# Patient Record
Sex: Female | Born: 1975 | Race: White | Hispanic: Yes | Marital: Married | Smoking: Never smoker
Health system: Southern US, Community
[De-identification: ages and names within clinical notes are randomized; demographics above are authoritative.]

## PROBLEM LIST (undated history)

## (undated) DIAGNOSIS — F329 Major depressive disorder, single episode, unspecified: Secondary | ICD-10-CM

## (undated) DIAGNOSIS — O139 Gestational [pregnancy-induced] hypertension without significant proteinuria, unspecified trimester: Secondary | ICD-10-CM

## (undated) DIAGNOSIS — F32A Depression, unspecified: Secondary | ICD-10-CM

## (undated) DIAGNOSIS — O24419 Gestational diabetes mellitus in pregnancy, unspecified control: Secondary | ICD-10-CM

## (undated) HISTORY — DX: Depression, unspecified: F32.A

## (undated) HISTORY — DX: Gestational diabetes mellitus in pregnancy, unspecified control: O24.419

## (undated) HISTORY — DX: Major depressive disorder, single episode, unspecified: F32.9

## (undated) HISTORY — DX: Gestational (pregnancy-induced) hypertension without significant proteinuria, unspecified trimester: O13.9

---

## 2000-05-13 ENCOUNTER — Encounter: Payer: Self-pay | Admitting: *Deleted

## 2000-05-13 ENCOUNTER — Ambulatory Visit (HOSPITAL_COMMUNITY): Admission: RE | Admit: 2000-05-13 | Discharge: 2000-05-13 | Payer: Self-pay | Admitting: *Deleted

## 2000-10-28 ENCOUNTER — Inpatient Hospital Stay (HOSPITAL_COMMUNITY): Admission: AD | Admit: 2000-10-28 | Discharge: 2000-10-30 | Payer: Self-pay | Admitting: *Deleted

## 2000-10-30 ENCOUNTER — Encounter: Payer: Self-pay | Admitting: Obstetrics & Gynecology

## 2000-11-02 ENCOUNTER — Inpatient Hospital Stay (HOSPITAL_COMMUNITY): Admission: AD | Admit: 2000-11-02 | Discharge: 2000-11-05 | Payer: Self-pay | Admitting: Obstetrics

## 2003-03-22 ENCOUNTER — Encounter: Admission: RE | Admit: 2003-03-22 | Discharge: 2003-03-22 | Payer: Self-pay | Admitting: Internal Medicine

## 2003-06-06 ENCOUNTER — Encounter: Admission: RE | Admit: 2003-06-06 | Discharge: 2003-06-06 | Payer: Self-pay | Admitting: *Deleted

## 2003-06-06 ENCOUNTER — Inpatient Hospital Stay (HOSPITAL_COMMUNITY): Admission: AD | Admit: 2003-06-06 | Discharge: 2003-06-09 | Payer: Self-pay | Admitting: *Deleted

## 2003-06-06 ENCOUNTER — Encounter: Payer: Self-pay | Admitting: Obstetrics & Gynecology

## 2005-04-10 ENCOUNTER — Ambulatory Visit (HOSPITAL_COMMUNITY): Admission: RE | Admit: 2005-04-10 | Discharge: 2005-04-10 | Payer: Self-pay | Admitting: *Deleted

## 2005-06-21 ENCOUNTER — Ambulatory Visit (HOSPITAL_COMMUNITY): Admission: RE | Admit: 2005-06-21 | Discharge: 2005-06-21 | Payer: Self-pay | Admitting: *Deleted

## 2005-08-21 ENCOUNTER — Ambulatory Visit (HOSPITAL_COMMUNITY): Admission: RE | Admit: 2005-08-21 | Discharge: 2005-08-21 | Payer: Self-pay | Admitting: *Deleted

## 2005-08-26 ENCOUNTER — Ambulatory Visit: Payer: Self-pay | Admitting: Hematology and Oncology

## 2005-08-29 ENCOUNTER — Ambulatory Visit: Payer: Self-pay | Admitting: Family Medicine

## 2005-09-02 ENCOUNTER — Inpatient Hospital Stay (HOSPITAL_COMMUNITY): Admission: AD | Admit: 2005-09-02 | Discharge: 2005-09-03 | Payer: Self-pay | Admitting: *Deleted

## 2005-09-02 ENCOUNTER — Ambulatory Visit: Payer: Self-pay | Admitting: Obstetrics & Gynecology

## 2005-09-04 ENCOUNTER — Ambulatory Visit: Payer: Self-pay | Admitting: Family Medicine

## 2005-09-04 ENCOUNTER — Inpatient Hospital Stay (HOSPITAL_COMMUNITY): Admission: AD | Admit: 2005-09-04 | Discharge: 2005-09-04 | Payer: Self-pay | Admitting: *Deleted

## 2005-09-05 ENCOUNTER — Inpatient Hospital Stay (HOSPITAL_COMMUNITY): Admission: AD | Admit: 2005-09-05 | Discharge: 2005-09-07 | Payer: Self-pay | Admitting: Obstetrics & Gynecology

## 2005-09-05 ENCOUNTER — Ambulatory Visit: Payer: Self-pay | Admitting: Family Medicine

## 2006-06-27 IMAGING — US US ABDOMEN COMPLETE
1 series · 14 of 25 positions shown · non-contrast
Comparison: none

CLINICAL DATA: 40 weeks pregnant. Right upper quadrant pain

ABDOMEN ULTRASOUND:
TECHNIQUE: Complete abdominal ultrasound examination was performed including
evaluation of the liver, gallbladder, bile ducts, pancreas, kidneys, spleen,
IVC, and abdominal aorta.

[Series 1: us abdomen complete · 0.39mm/px · 14 of 56 slices shown]
[im 1/56]
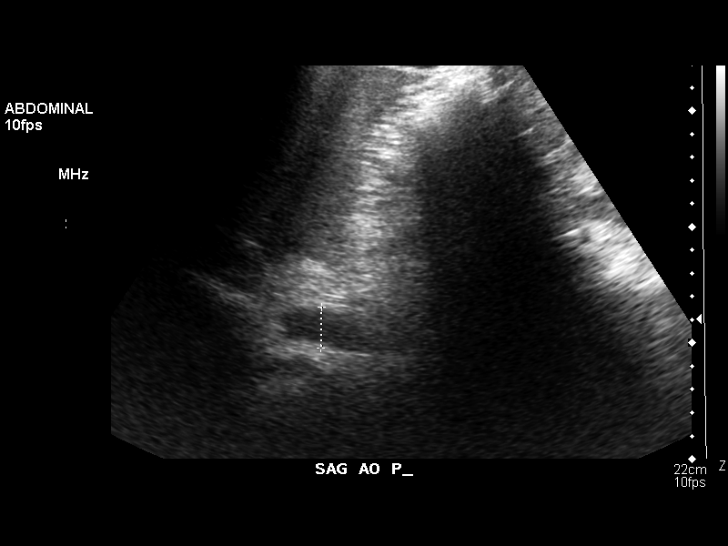
[im 5/56]
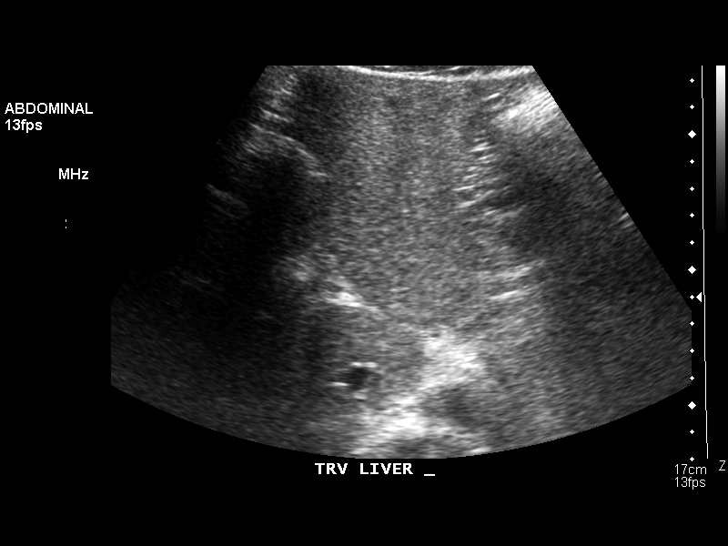
[im 10/56]
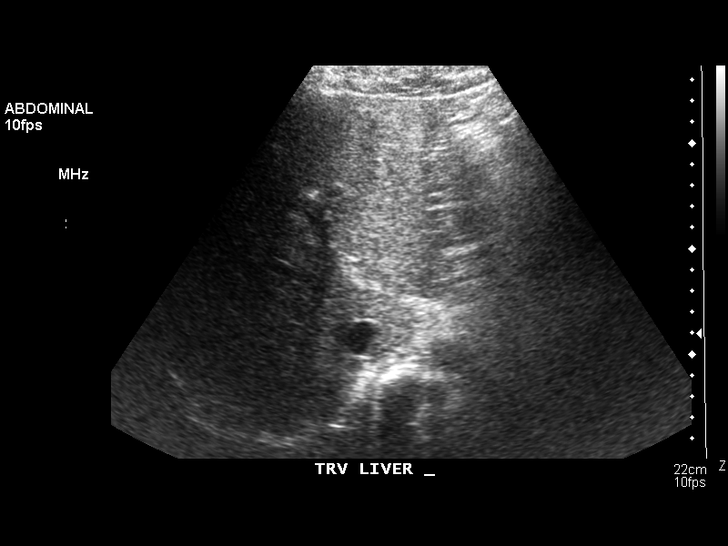
[im 14/56]
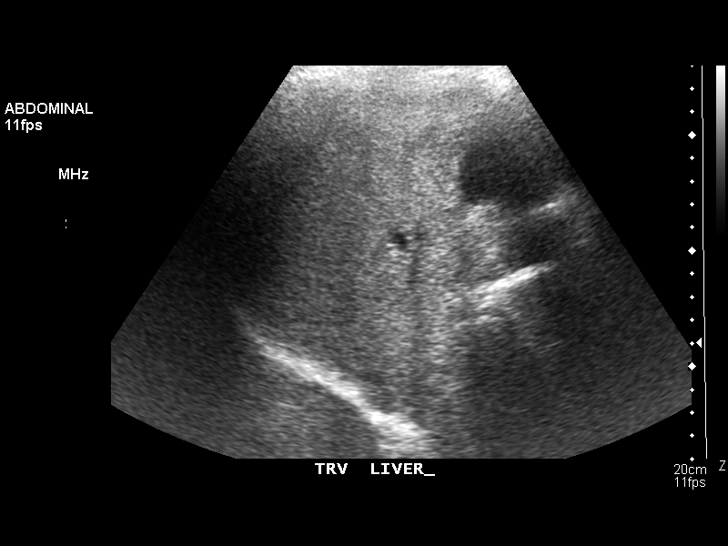
[im 19/56]
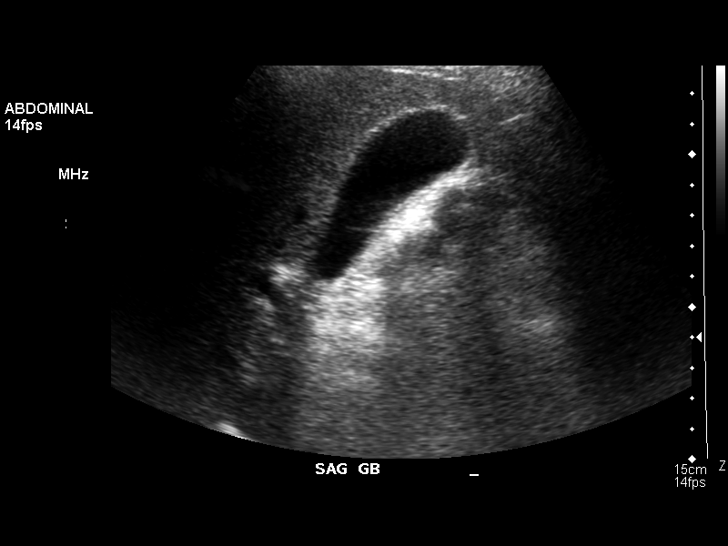
[im 21/56]
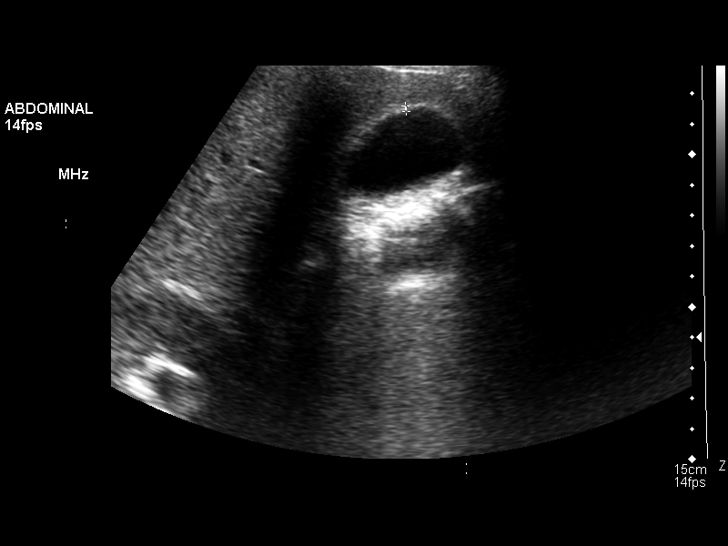
[im 26/56]
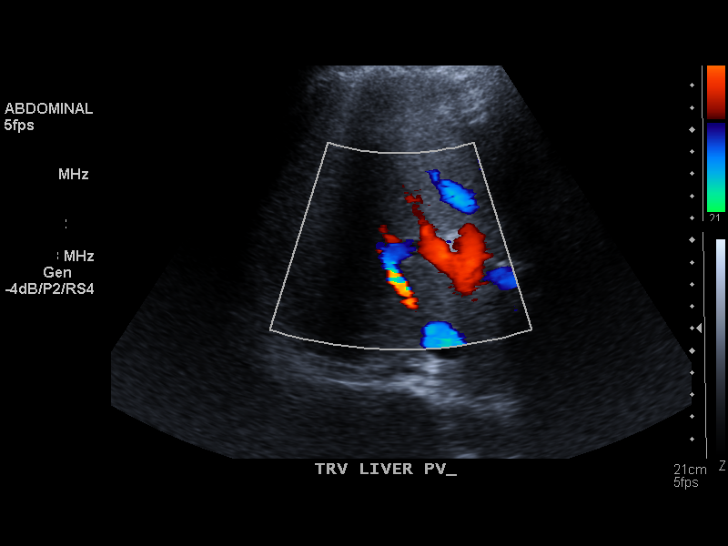
[im 30/56]
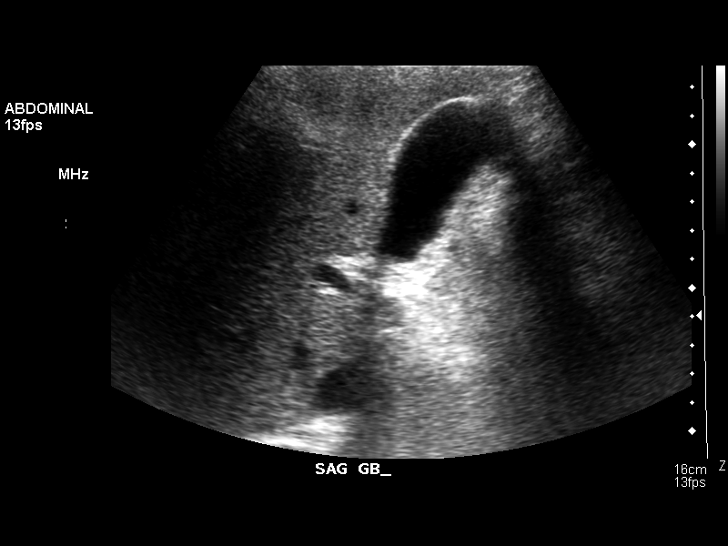
[im 35/56]
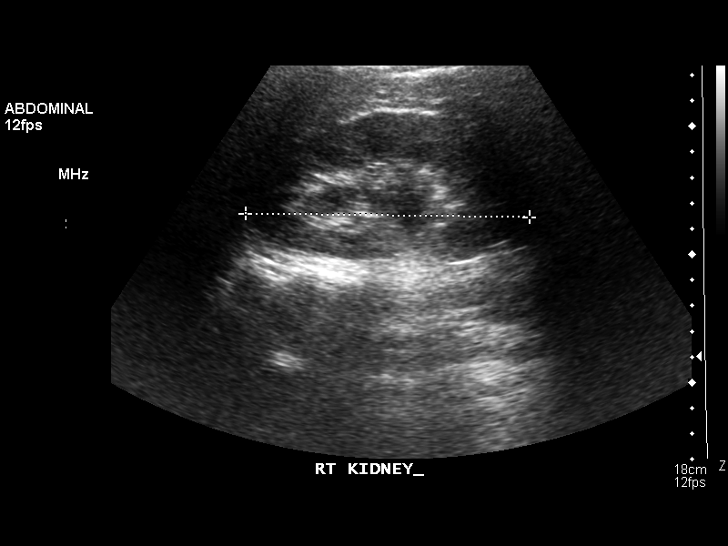
[im 37/56]
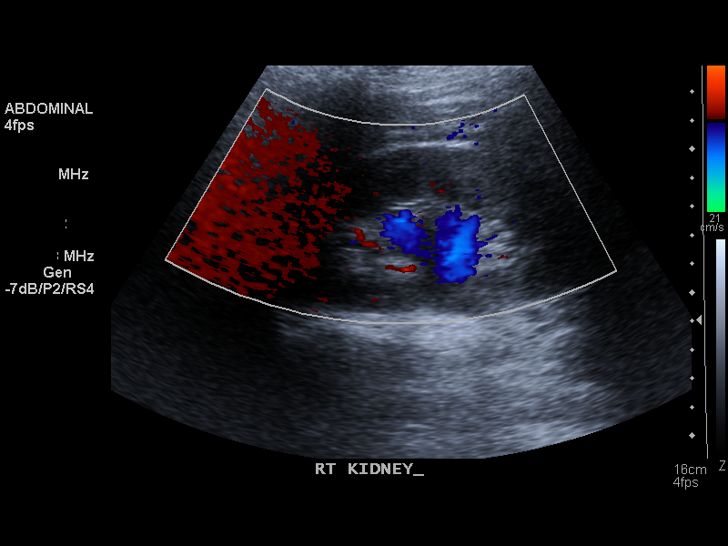
[im 42/56]
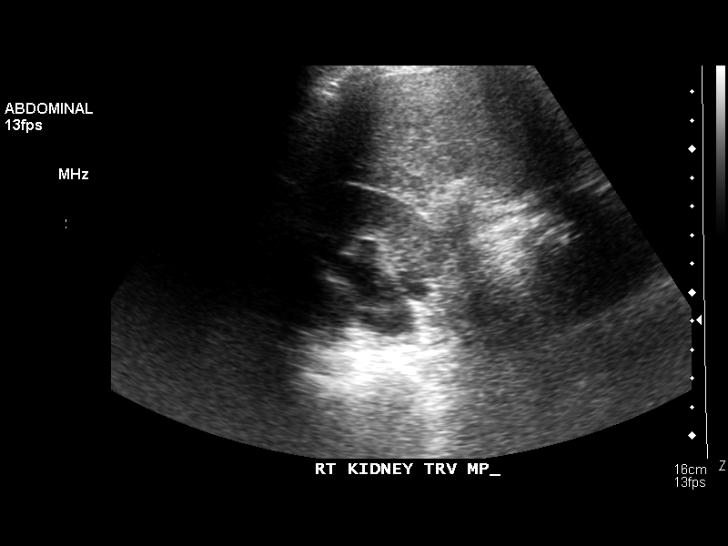
[im 46/56]
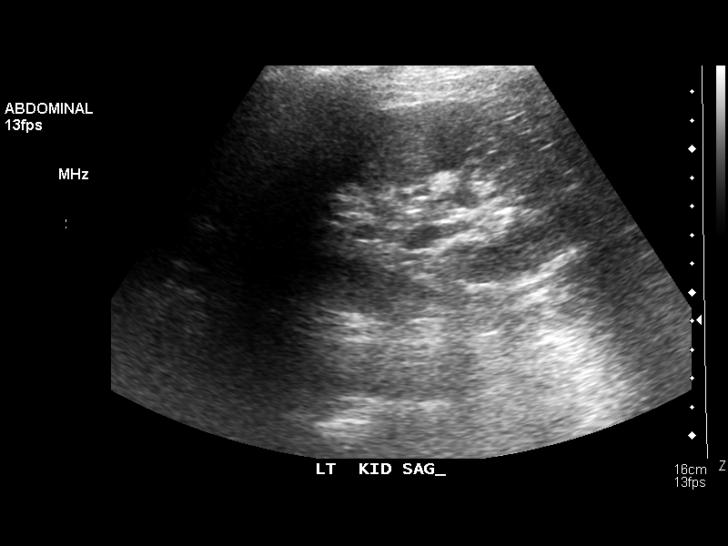
[im 51/56]
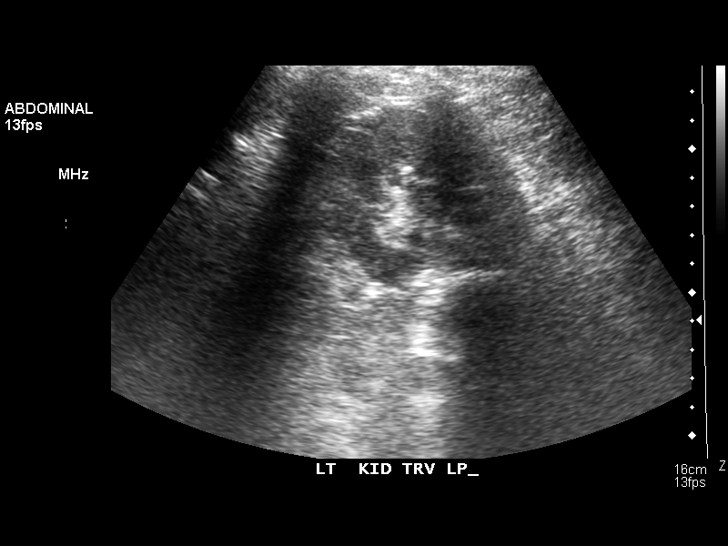
[im 56/56]
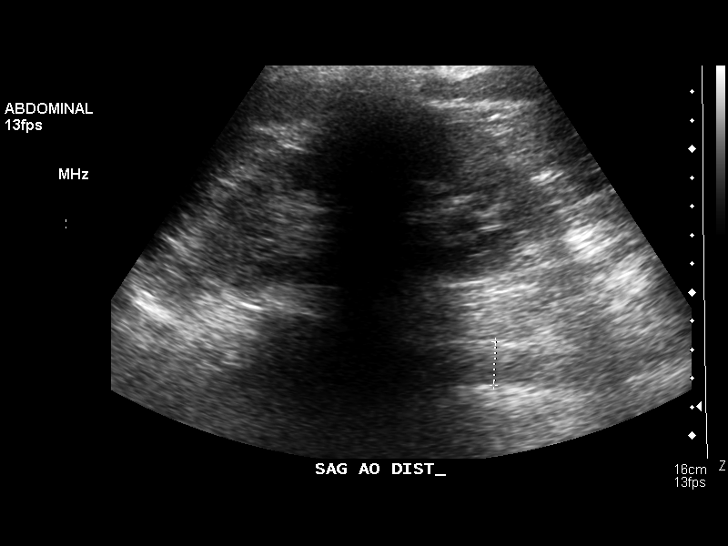

[14 of 25 positions shown; findings below may reference images not displayed]

FINDINGS: There is no evidence of gallstones or biliary ductal dilatation.  The
liver is within normal limits in echogenicity, and no focal liver lesions are
seen.  The visualized portions of the IVC are unremarkable. Pancreas not
visualized.

There is no evidence of splenomegaly.  Mild right hydronephrosis, felt to be
within normal limits with 40 week pregnancy. Left kidney unremarkable.  The
abdominal aorta is non-dilated.
IMPRESSION: Negative abdominal ultrasound.

## 2007-02-27 ENCOUNTER — Ambulatory Visit (HOSPITAL_COMMUNITY): Admission: RE | Admit: 2007-02-27 | Discharge: 2007-02-27 | Payer: Self-pay | Admitting: Obstetrics & Gynecology

## 2007-07-02 ENCOUNTER — Ambulatory Visit: Payer: Self-pay | Admitting: Gynecology

## 2007-07-06 ENCOUNTER — Ambulatory Visit: Payer: Self-pay | Admitting: Obstetrics & Gynecology

## 2007-07-06 ENCOUNTER — Inpatient Hospital Stay (HOSPITAL_COMMUNITY): Admission: AD | Admit: 2007-07-06 | Discharge: 2007-07-09 | Payer: Self-pay | Admitting: Gynecology

## 2007-08-30 ENCOUNTER — Ambulatory Visit: Payer: Self-pay | Admitting: Obstetrics & Gynecology

## 2007-08-30 ENCOUNTER — Inpatient Hospital Stay (HOSPITAL_COMMUNITY): Admission: AD | Admit: 2007-08-30 | Discharge: 2007-08-30 | Payer: Self-pay | Admitting: Obstetrics & Gynecology

## 2008-06-21 ENCOUNTER — Emergency Department (HOSPITAL_COMMUNITY): Admission: EM | Admit: 2008-06-21 | Discharge: 2008-06-21 | Payer: Self-pay | Admitting: Emergency Medicine

## 2008-08-22 ENCOUNTER — Inpatient Hospital Stay (HOSPITAL_COMMUNITY): Admission: AD | Admit: 2008-08-22 | Discharge: 2008-08-22 | Payer: Self-pay | Admitting: Obstetrics & Gynecology

## 2008-09-15 ENCOUNTER — Ambulatory Visit (HOSPITAL_COMMUNITY): Admission: RE | Admit: 2008-09-15 | Discharge: 2008-09-15 | Payer: Self-pay | Admitting: Family Medicine

## 2008-09-28 ENCOUNTER — Ambulatory Visit (HOSPITAL_COMMUNITY): Admission: RE | Admit: 2008-09-28 | Discharge: 2008-09-28 | Payer: Self-pay | Admitting: Family Medicine

## 2009-01-13 ENCOUNTER — Ambulatory Visit: Payer: Self-pay | Admitting: Obstetrics & Gynecology

## 2009-02-02 ENCOUNTER — Ambulatory Visit: Payer: Self-pay | Admitting: Family Medicine

## 2009-02-07 ENCOUNTER — Inpatient Hospital Stay (HOSPITAL_COMMUNITY): Admission: AD | Admit: 2009-02-07 | Discharge: 2009-02-10 | Payer: Self-pay | Admitting: Obstetrics & Gynecology

## 2009-02-08 ENCOUNTER — Ambulatory Visit: Payer: Self-pay | Admitting: Obstetrics & Gynecology

## 2009-02-19 ENCOUNTER — Ambulatory Visit: Payer: Self-pay | Admitting: Obstetrics and Gynecology

## 2009-02-19 ENCOUNTER — Inpatient Hospital Stay (HOSPITAL_COMMUNITY): Admission: AD | Admit: 2009-02-19 | Discharge: 2009-02-19 | Payer: Self-pay | Admitting: Obstetrics and Gynecology

## 2010-05-09 ENCOUNTER — Inpatient Hospital Stay (HOSPITAL_COMMUNITY): Admission: AD | Admit: 2010-05-09 | Discharge: 2010-05-09 | Payer: Self-pay | Admitting: Obstetrics & Gynecology

## 2010-05-09 ENCOUNTER — Inpatient Hospital Stay (HOSPITAL_COMMUNITY): Admission: AD | Admit: 2010-05-09 | Discharge: 2010-05-10 | Payer: Self-pay | Admitting: Obstetrics & Gynecology

## 2010-05-09 ENCOUNTER — Ambulatory Visit: Payer: Self-pay | Admitting: Gynecology

## 2010-05-23 ENCOUNTER — Ambulatory Visit: Payer: Self-pay | Admitting: Obstetrics and Gynecology

## 2010-05-23 LAB — CONVERTED CEMR LAB: hCG, Beta Chain, Quant, S: 161.1 milliintl units/mL

## 2010-06-06 ENCOUNTER — Ambulatory Visit: Payer: Self-pay | Admitting: Obstetrics and Gynecology

## 2010-06-06 ENCOUNTER — Encounter: Payer: Self-pay | Admitting: Family Medicine

## 2010-06-06 LAB — CONVERTED CEMR LAB: hCG, Beta Chain, Quant, S: 39.6 milliintl units/mL

## 2010-06-20 ENCOUNTER — Encounter: Payer: Self-pay | Admitting: Family Medicine

## 2010-06-20 ENCOUNTER — Ambulatory Visit: Payer: Self-pay | Admitting: Obstetrics and Gynecology

## 2010-11-04 ENCOUNTER — Encounter: Payer: Self-pay | Admitting: Obstetrics & Gynecology

## 2010-12-29 LAB — URINALYSIS, ROUTINE W REFLEX MICROSCOPIC
Bilirubin Urine: NEGATIVE
Glucose, UA: NEGATIVE mg/dL

## 2010-12-29 LAB — CBC
HCT: 33.9 % — ABNORMAL LOW (ref 36.0–46.0)
HCT: 35 % — ABNORMAL LOW (ref 36.0–46.0)
MCHC: 34.5 g/dL (ref 30.0–36.0)
MCV: 88.9 fL (ref 78.0–100.0)
MCV: 89.3 fL (ref 78.0–100.0)
Platelets: 121 10*3/uL — ABNORMAL LOW (ref 150–400)
Platelets: 141 10*3/uL — ABNORMAL LOW (ref 150–400)
RDW: 13.1 % (ref 11.5–15.5)
WBC: 8.6 10*3/uL (ref 4.0–10.5)

## 2010-12-29 LAB — DIC (DISSEMINATED INTRAVASCULAR COAGULATION)PANEL
INR: 0.98 (ref 0.00–1.49)
Platelets: 120 10*3/uL — ABNORMAL LOW (ref 150–400)

## 2010-12-29 LAB — DIFFERENTIAL
Basophils Absolute: 0 10*3/uL (ref 0.0–0.1)
Basophils Relative: 0 % (ref 0–1)
Eosinophils Absolute: 0.1 10*3/uL (ref 0.0–0.7)
Lymphocytes Relative: 28 % (ref 12–46)
Lymphs Abs: 2.4 10*3/uL (ref 0.7–4.0)
Monocytes Relative: 8 % (ref 3–12)
Neutro Abs: 5.4 10*3/uL (ref 1.7–7.7)
Neutrophils Relative %: 62 % (ref 43–77)

## 2010-12-29 LAB — TYPE AND SCREEN: Antibody Screen: NEGATIVE

## 2010-12-29 LAB — WET PREP, GENITAL: Clue Cells Wet Prep HPF POC: NONE SEEN

## 2010-12-29 LAB — HCG, QUANTITATIVE, PREGNANCY: hCG, Beta Chain, Quant, S: 3103 m[IU]/mL — ABNORMAL HIGH (ref <5)

## 2010-12-29 LAB — GC/CHLAMYDIA PROBE AMP, GENITAL: Chlamydia, DNA Probe: NEGATIVE

## 2010-12-29 LAB — URINE MICROSCOPIC-ADD ON

## 2011-01-03 ENCOUNTER — Ambulatory Visit
Admission: RE | Admit: 2011-01-03 | Discharge: 2011-01-03 | Disposition: A | Discharge status: Home / Self Care | Payer: Self-pay | Source: Ambulatory Visit | Attending: Geriatric Medicine | Admitting: Geriatric Medicine

## 2011-01-03 ENCOUNTER — Other Ambulatory Visit: Payer: Self-pay | Admitting: Geriatric Medicine

## 2011-01-03 DIAGNOSIS — M79673 Pain in unspecified foot: Secondary | ICD-10-CM

## 2011-01-22 LAB — DIFFERENTIAL
Eosinophils Absolute: 0.1 10*3/uL (ref 0.0–0.7)
Eosinophils Relative: 1 % (ref 0–5)
Lymphocytes Relative: 16 % (ref 12–46)
Lymphs Abs: 1.1 10*3/uL (ref 0.7–4.0)
Monocytes Relative: 6 % (ref 3–12)

## 2011-01-22 LAB — URINALYSIS, ROUTINE W REFLEX MICROSCOPIC
Bilirubin Urine: NEGATIVE
Glucose, UA: NEGATIVE mg/dL
Ketones, ur: NEGATIVE mg/dL
Leukocytes, UA: NEGATIVE
Nitrite: NEGATIVE
Protein, ur: NEGATIVE mg/dL
Specific Gravity, Urine: 1.02 (ref 1.005–1.030)
Urobilinogen, UA: 0.2 mg/dL (ref 0.0–1.0)
pH: 5.5 (ref 5.0–8.0)

## 2011-01-22 LAB — URINE MICROSCOPIC-ADD ON

## 2011-01-22 LAB — CBC
HCT: 28.1 % — ABNORMAL LOW (ref 36.0–46.0)
MCV: 90.1 fL (ref 78.0–100.0)
RBC: 3.12 MIL/uL — ABNORMAL LOW (ref 3.87–5.11)
WBC: 6.7 10*3/uL (ref 4.0–10.5)

## 2011-01-23 LAB — RPR: RPR Ser Ql: NONREACTIVE

## 2011-01-23 LAB — CBC
Hemoglobin: 12.8 g/dL (ref 12.0–15.0)
MCV: 90.4 fL (ref 78.0–100.0)
RBC: 4.06 MIL/uL (ref 3.87–5.11)
WBC: 5.9 10*3/uL (ref 4.0–10.5)

## 2011-03-01 NOTE — Discharge Summary (Signed)
[facility]  Patient:    Jacqueline Bennett, Jacqueline Bennett                MRN: [id number] Adm. Date:  10/28/00 Disc. Date: 10/30/00 Attending:  Conni Elliot, M.D. Dictator:   Ebbie Ridge, M.D.                           Discharge Summary  DISCHARGE DIAGNOSES:          1. Transient antepartum hypertension.                               2. Threatened labor.                               3. Failed induction.  DISCHARGE MEDICATIONS:        None.  HOSPITAL COURSE:              Mrs. Jacqueline Bennett is a 35 year old, G1, P0, Hispanic female who presented at 38-2/7 weeks for elevated blood pressure, proteinuria, and headache. She was admitted for further evaluation and to rule out preeclampsia. A 24-hour urine was obtained which was normal. Cytotec was placed on the night of admission, but the patient did not have ripening of her cervix. Once [facility] labs returned and were normal, and 48 hours of monitoring were consistent with fetal well being, the patient was discharged to home and instructed for bed rest and kick counts. She was to return with symptoms of headache, worsening right upper quadrant pain, feet and hand swelling, or contractions. If she does not return before January 20, she is to return at that time for induction at 9:45 p.m.DD:  03/26/01 TD:  03/26/01 Job: [id number]

## 2011-07-16 LAB — DIC (DISSEMINATED INTRAVASCULAR COAGULATION)PANEL
D-Dimer, Quant: 0.9 — ABNORMAL HIGH
Fibrinogen: 545 — ABNORMAL HIGH
INR: 0.9
aPTT: 26

## 2011-07-16 LAB — VON WILLEBRAND PANEL
Factor-VIII Activity: 48 % — ABNORMAL LOW (ref 50–150)
Ristocetin Co-Factor: <20 % (ref 50–150)
Von Willebrand Ag: 39 % normal — ABNORMAL LOW (ref 61–164)

## 2011-07-16 LAB — COMPREHENSIVE METABOLIC PANEL
Albumin: 3.2 — ABNORMAL LOW
BUN: 4 — ABNORMAL LOW
Calcium: 8.9
Creatinine, Ser: 0.36 — ABNORMAL LOW
GFR calc Af Amer: >60
Total Bilirubin: 0.5
Total Protein: 6.6

## 2011-07-16 LAB — CBC
HCT: 35.3 — ABNORMAL LOW
MCHC: 34
MCV: 90.2
Platelets: 108 — ABNORMAL LOW
RDW: 13.8

## 2011-07-17 LAB — COMPREHENSIVE METABOLIC PANEL
AST: 18
Albumin: 3.7
Chloride: 103
Creatinine, Ser: 0.49
GFR calc Af Amer: >60
Sodium: 138
Total Bilirubin: 0.8

## 2011-07-17 LAB — POCT CARDIAC MARKERS
CKMB, poc: <1 — ABNORMAL LOW
Myoglobin, poc: 63.2
Troponin i, poc: <0.05

## 2011-07-17 LAB — URINALYSIS, ROUTINE W REFLEX MICROSCOPIC
Glucose, UA: NEGATIVE
Hgb urine dipstick: NEGATIVE
Specific Gravity, Urine: 1.007

## 2011-07-17 LAB — CBC
MCV: 88.8
Platelets: 130 — ABNORMAL LOW
WBC: 5.7

## 2011-07-17 LAB — DIFFERENTIAL
Basophils Absolute: 0
Eosinophils Relative: 1
Lymphocytes Relative: 27
Lymphs Abs: 1.5
Monocytes Absolute: 0.4

## 2011-07-17 LAB — POCT PREGNANCY, URINE: Preg Test, Ur: POSITIVE

## 2011-07-18 LAB — RISK ASSESSMENT (MID TRIMESTER)
Age Alone: 1:314 {titer}
Down Syndrome Scr Risk Est: 1:4080 {titer}
Trisomy 18 (Edward) Syndrome Interp.: 1:47000 {titer}

## 2011-07-18 LAB — CLINICAL INFORMATION: Specimen Number: 1

## 2011-07-18 LAB — AFP SCREEN CLINICAL RESULTS
AFP MoM: 1.66
AFP: 46.9

## 2011-07-23 LAB — URINE MICROSCOPIC-ADD ON

## 2011-07-23 LAB — URINALYSIS, ROUTINE W REFLEX MICROSCOPIC
Bilirubin Urine: NEGATIVE
Glucose, UA: NEGATIVE
Hgb urine dipstick: NEGATIVE
Specific Gravity, Urine: 1.02
Urobilinogen, UA: 0.2

## 2011-07-23 LAB — URINE CULTURE

## 2011-07-25 LAB — CBC
HCT: 34.4 — ABNORMAL LOW
Hemoglobin: 11.6 — ABNORMAL LOW
Hemoglobin: 12.8
RBC: 4.28
WBC: 7.1

## 2011-07-25 LAB — PLATELET COUNT: Platelets: 74 — ABNORMAL LOW

## 2012-10-14 NOTE — L&D Delivery Note (Signed)
Delivery Note At 3:10 PM a viable female was delivered via  (Presentation: ;  ).  APGAR: , ; weight .   Placenta status: , .  Cord:  with the following complications: .  Cord pH: not done  Anesthesia: Epidural  Episiotomy:  Lacerations:  Suture Repair: 2.0 Est. Blood Loss (mL):   Mom to postpartum.  Baby to Couplet care / Skin to Skin.  Chellsea Beckers A 10/05/2013, 3:17 PM    Delivery Note At 3:10 PM a viable female was delivered via Vaginal, Spontaneous Delivery (Presentation: Left Occiput Anterior).  APGAR: 8, 9; weight 7 lb 5.6 oz (3334 g).   Placenta status: Intact, Spontaneous.  Cord: 2 vessels with the following complications: None.  Cornot doned pH: **not done*  Anesthesia: Epidural  Episiotomy: None Lacerations: None Suture Repair: 2.0 Est. Blood Loss (mL): 250  Mom to postpartum.  Baby to Couplet care / Skin to Skin.  Tallulah Hosman A 10/12/2013, 6:32 AM

## 2013-04-06 LAB — OB RESULTS CONSOLE RUBELLA ANTIBODY, IGM: Rubella: IMMUNE

## 2013-04-06 LAB — OB RESULTS CONSOLE HIV ANTIBODY (ROUTINE TESTING): HIV: NONREACTIVE

## 2013-04-06 LAB — OB RESULTS CONSOLE HEPATITIS B SURFACE ANTIGEN: Hepatitis B Surface Ag: NEGATIVE

## 2013-04-06 LAB — OB RESULTS CONSOLE RPR: RPR: NONREACTIVE

## 2013-04-06 LAB — OB RESULTS CONSOLE ABO/RH: RH Type: POSITIVE

## 2013-09-30 ENCOUNTER — Encounter (HOSPITAL_COMMUNITY): Payer: Self-pay | Admitting: *Deleted

## 2013-09-30 ENCOUNTER — Telehealth (HOSPITAL_COMMUNITY): Payer: Self-pay | Admitting: *Deleted

## 2013-09-30 NOTE — Telephone Encounter (Signed)
Preadmission screen [id number] interpreter number

## 2013-10-04 ENCOUNTER — Inpatient Hospital Stay (HOSPITAL_COMMUNITY)
Admission: RE | Admit: 2013-10-04 | Discharge: 2013-10-06 | DRG: 775 | Disposition: A | Discharge status: Home / Self Care | Payer: Medicaid Other | Source: Ambulatory Visit | Attending: Obstetrics | Admitting: Obstetrics

## 2013-10-04 ENCOUNTER — Encounter (HOSPITAL_COMMUNITY): Payer: Self-pay

## 2013-10-04 DIAGNOSIS — O09529 Supervision of elderly multigravida, unspecified trimester: Principal | ICD-10-CM | POA: Diagnosis present

## 2013-10-04 LAB — CBC
HCT: 31.3 % — ABNORMAL LOW (ref 36.0–46.0)
Hemoglobin: 10 g/dL — ABNORMAL LOW (ref 12.0–15.0)
MCHC: 31.9 g/dL (ref 30.0–36.0)
MCV: 74.5 fL — ABNORMAL LOW (ref 78.0–100.0)
Platelets: 130 10*3/uL — ABNORMAL LOW (ref 150–400)
WBC: 6.9 10*3/uL (ref 4.0–10.5)

## 2013-10-04 MED ORDER — LIDOCAINE HCL (PF) 1 % IJ SOLN
30.0000 mL | INTRAMUSCULAR | Status: DC | PRN
  Filled 2013-10-04: qty 30

## 2013-10-04 MED ORDER — BUTORPHANOL TARTRATE 1 MG/ML IJ SOLN: 1.0000 mg | INTRAMUSCULAR | Status: DC | PRN

## 2013-10-04 MED ORDER — FLEET ENEMA 7-19 GM/118ML RE ENEM: 1.0000 | ENEMA | RECTAL | Status: DC | PRN

## 2013-10-04 MED ORDER — LACTATED RINGERS IV SOLN
INTRAVENOUS | Status: DC
  Administered 2013-10-04 – 2013-10-05 (×2): via INTRAVENOUS

## 2013-10-04 MED ORDER — OXYTOCIN BOLUS FROM INFUSION: 500.0000 mL | INTRAVENOUS | Status: DC

## 2013-10-04 MED ORDER — LACTATED RINGERS IV SOLN: 500.0000 mL | Freq: Once | INTRAVENOUS | Status: DC

## 2013-10-04 MED ORDER — ONDANSETRON HCL 4 MG/2ML IJ SOLN
4.0000 mg | Freq: Four times a day (QID) | INTRAMUSCULAR | Status: DC | PRN
  Administered 2013-10-05: 4 mg via INTRAVENOUS
  Filled 2013-10-04: qty 2

## 2013-10-04 MED ORDER — EPHEDRINE 5 MG/ML INJ
10.0000 mg | INTRAVENOUS | Status: DC | PRN
  Filled 2013-10-04: qty 2
  Filled 2013-10-04: qty 4

## 2013-10-04 MED ORDER — CITRIC ACID-SODIUM CITRATE 334-500 MG/5ML PO SOLN: 30.0000 mL | ORAL | Status: DC | PRN

## 2013-10-04 MED ORDER — OXYTOCIN 40 UNITS IN LACTATED RINGERS INFUSION - SIMPLE MED: 62.5000 mL/h | INTRAVENOUS | Status: DC

## 2013-10-04 MED ORDER — OXYCODONE-ACETAMINOPHEN 5-325 MG PO TABS: 1.0000 | ORAL_TABLET | ORAL | Status: DC | PRN

## 2013-10-04 MED ORDER — TERBUTALINE SULFATE 1 MG/ML IJ SOLN: 0.2500 mg | Freq: Once | INTRAMUSCULAR | Status: AC | PRN

## 2013-10-04 MED ORDER — LACTATED RINGERS IV SOLN: 500.0000 mL | INTRAVENOUS | Status: DC | PRN

## 2013-10-04 MED ORDER — ACETAMINOPHEN 325 MG PO TABS: 650.0000 mg | ORAL_TABLET | ORAL | Status: DC | PRN

## 2013-10-04 MED ORDER — PHENYLEPHRINE 40 MCG/ML (10ML) SYRINGE FOR IV PUSH (FOR BLOOD PRESSURE SUPPORT)
80.0000 ug | PREFILLED_SYRINGE | INTRAVENOUS | Status: DC | PRN
  Filled 2013-10-04: qty 10
  Filled 2013-10-04: qty 2

## 2013-10-04 MED ORDER — FENTANYL 2.5 MCG/ML BUPIVACAINE 1/10 % EPIDURAL INFUSION (WH - ANES)
14.0000 mL/h | INTRAMUSCULAR | Status: DC | PRN
  Administered 2013-10-05 (×2): 14 mL/h via EPIDURAL
  Filled 2013-10-04 (×2): qty 125

## 2013-10-04 MED ORDER — EPHEDRINE 5 MG/ML INJ
10.0000 mg | INTRAVENOUS | Status: DC | PRN
  Filled 2013-10-04: qty 2

## 2013-10-04 MED ORDER — DIPHENHYDRAMINE HCL 50 MG/ML IJ SOLN: 12.5000 mg | INTRAMUSCULAR | Status: DC | PRN

## 2013-10-04 MED ORDER — PHENYLEPHRINE 40 MCG/ML (10ML) SYRINGE FOR IV PUSH (FOR BLOOD PRESSURE SUPPORT)
80.0000 ug | PREFILLED_SYRINGE | INTRAVENOUS | Status: DC | PRN
  Filled 2013-10-04: qty 2

## 2013-10-04 MED ORDER — OXYTOCIN 40 UNITS IN LACTATED RINGERS INFUSION - SIMPLE MED
1.0000 m[IU]/min | INTRAVENOUS | Status: DC
  Administered 2013-10-04: 2 m[IU]/min via INTRAVENOUS
  Filled 2013-10-04: qty 1000

## 2013-10-04 MED ORDER — IBUPROFEN 600 MG PO TABS: 600.0000 mg | ORAL_TABLET | Freq: Four times a day (QID) | ORAL | Status: DC | PRN

## 2013-10-05 ENCOUNTER — Encounter (HOSPITAL_COMMUNITY): Payer: Medicaid Other | Admitting: Anesthesiology

## 2013-10-05 ENCOUNTER — Inpatient Hospital Stay (HOSPITAL_COMMUNITY): Payer: Medicaid Other | Admitting: Anesthesiology

## 2013-10-05 ENCOUNTER — Encounter (HOSPITAL_COMMUNITY): Payer: Self-pay

## 2013-10-05 LAB — CBC
Hemoglobin: 9.4 g/dL — ABNORMAL LOW (ref 12.0–15.0)
MCH: 23.7 pg — ABNORMAL LOW (ref 26.0–34.0)
MCHC: 31.9 g/dL (ref 30.0–36.0)
MCV: 74.3 fL — ABNORMAL LOW (ref 78.0–100.0)
RBC: 3.97 MIL/uL (ref 3.87–5.11)
RDW: 15.3 % (ref 11.5–15.5)

## 2013-10-05 LAB — TYPE AND SCREEN
ABO/RH(D): O POS
Antibody Screen: NEGATIVE

## 2013-10-05 MED ORDER — IBUPROFEN 600 MG PO TABS
600.0000 mg | ORAL_TABLET | Freq: Four times a day (QID) | ORAL | Status: DC
  Administered 2013-10-05 – 2013-10-06 (×4): 600 mg via ORAL
  Filled 2013-10-05 (×4): qty 1

## 2013-10-05 MED ORDER — ZOLPIDEM TARTRATE 5 MG PO TABS: 5.0000 mg | ORAL_TABLET | Freq: Every evening | ORAL | Status: DC | PRN

## 2013-10-05 MED ORDER — FERROUS SULFATE 325 (65 FE) MG PO TABS
325.0000 mg | ORAL_TABLET | Freq: Two times a day (BID) | ORAL | Status: DC
  Administered 2013-10-06: 325 mg via ORAL
  Filled 2013-10-05: qty 1

## 2013-10-05 MED ORDER — PRENATAL MULTIVITAMIN CH
1.0000 | ORAL_TABLET | Freq: Every day | ORAL | Status: DC
  Administered 2013-10-06: 1 via ORAL
  Filled 2013-10-05: qty 1

## 2013-10-05 MED ORDER — OXYCODONE-ACETAMINOPHEN 5-325 MG PO TABS: 1.0000 | ORAL_TABLET | ORAL | Status: DC | PRN

## 2013-10-05 MED ORDER — DIPHENHYDRAMINE HCL 25 MG PO CAPS: 25.0000 mg | ORAL_CAPSULE | Freq: Four times a day (QID) | ORAL | Status: DC | PRN

## 2013-10-05 MED ORDER — ONDANSETRON HCL 4 MG/2ML IJ SOLN: 4.0000 mg | INTRAMUSCULAR | Status: DC | PRN

## 2013-10-05 MED ORDER — INFLUENZA VAC SPLIT QUAD 0.5 ML IM SUSP
0.5000 mL | INTRAMUSCULAR | Status: AC
  Administered 2013-10-06: 0.5 mL via INTRAMUSCULAR
  Filled 2013-10-05: qty 0.5

## 2013-10-05 MED ORDER — LIDOCAINE HCL (PF) 1 % IJ SOLN
INTRAMUSCULAR | Status: DC | PRN
  Administered 2013-10-05 (×2): 5 mL

## 2013-10-05 MED ORDER — BUPIVACAINE HCL (PF) 0.25 % IJ SOLN
INTRAMUSCULAR | Status: DC | PRN
  Administered 2013-10-05: 10 mL via EPIDURAL

## 2013-10-05 MED ORDER — SENNOSIDES-DOCUSATE SODIUM 8.6-50 MG PO TABS
2.0000 | ORAL_TABLET | ORAL | Status: DC
  Administered 2013-10-06: 2 via ORAL
  Filled 2013-10-05: qty 2

## 2013-10-05 MED ORDER — SIMETHICONE 80 MG PO CHEW: 80.0000 mg | CHEWABLE_TABLET | ORAL | Status: DC | PRN

## 2013-10-05 MED ORDER — WITCH HAZEL-GLYCERIN EX PADS
1.0000 "application " | MEDICATED_PAD | CUTANEOUS | Status: DC | PRN
  Administered 2013-10-05: 1 via TOPICAL

## 2013-10-05 MED ORDER — BENZOCAINE-MENTHOL 20-0.5 % EX AERO
1.0000 "application " | INHALATION_SPRAY | CUTANEOUS | Status: DC | PRN
  Administered 2013-10-05: 1 via TOPICAL
  Filled 2013-10-05: qty 56

## 2013-10-05 MED ORDER — DIBUCAINE 1 % RE OINT: 1.0000 "application " | TOPICAL_OINTMENT | RECTAL | Status: DC | PRN

## 2013-10-05 MED ORDER — ONDANSETRON HCL 4 MG PO TABS: 4.0000 mg | ORAL_TABLET | ORAL | Status: DC | PRN

## 2013-10-05 MED ORDER — LANOLIN HYDROUS EX OINT: TOPICAL_OINTMENT | CUTANEOUS | Status: DC | PRN

## 2013-10-05 MED ORDER — TETANUS-DIPHTH-ACELL PERTUSSIS 5-2.5-18.5 LF-MCG/0.5 IM SUSP
0.5000 mL | Freq: Once | INTRAMUSCULAR | Status: AC
  Administered 2013-10-06: 0.5 mL via INTRAMUSCULAR
  Filled 2013-10-05: qty 0.5

## 2013-10-05 NOTE — Anesthesia Preprocedure Evaluation (Addendum)
Anesthesia Evaluation  Patient identified by MRN, date of birth, ID band Patient awake    Reviewed: Allergy & Precautions, H&P , Patient's Chart, lab work & pertinent test results  Airway Mallampati: II TM Distance: >3 FB Neck ROM: full    Dental   Pulmonary  breath sounds clear to auscultation        Cardiovascular hypertension, Rhythm:regular Rate:Normal     Neuro/Psych PSYCHIATRIC DISORDERS Depression    GI/Hepatic   Endo/Other    Renal/GU      Musculoskeletal   Abdominal   Peds  Hematology   Anesthesia Other Findings   Reproductive/Obstetrics (+) Pregnancy                         Anesthesia Physical Anesthesia Plan  ASA: II  Anesthesia Plan: Epidural   Post-op Pain Management:    Induction:   Airway Management Planned:   Additional Equipment:   Intra-op Plan:   Post-operative Plan:   Informed Consent: I have reviewed the patients History and Physical, chart, labs and discussed the procedure including the risks, benefits and alternatives for the proposed anesthesia with the patient or authorized representative who has indicated his/her understanding and acceptance.     Plan Discussed with:   Anesthesia Plan Comments:        Anesthesia Quick Evaluation

## 2013-10-05 NOTE — Progress Notes (Signed)
Dr Sheral Apley updated and consulted, coming to see pt.

## 2013-10-05 NOTE — H&P (Signed)
This is Dr. Francoise Ceo dictating the history and physical on blank blank she's a 37 year old gravida 7 para 5 10/18/1939 weeks and 4 days EDC 10/08/2013 negative GBS and desires induction cervix 1 cm 80% vertex -3 amniotomy performed the fluids clear and she is on low-dose Pitocin Past medical history negative Past surgical history negative Social history negative System review negative Physical exam revealed a well-developed female in no distress HEENT negative Lungs clear to P&A Heart regular rhythm no murmurs no gallops Abdomen term Pelvic as described above Extremities negative

## 2013-10-05 NOTE — Progress Notes (Signed)
Dr Gaynell Face notified of pt's cough and pt coughing during delivery. He asked if she had fever. She has not had fever during day and current temp 97.4 and pt has not been drinkingfor several hours. He did not want any new orders at this time.

## 2013-10-05 NOTE — Anesthesia Procedure Notes (Signed)
Epidural Patient location during procedure: OB Start time: 10/05/2013 7:29 AM  Staffing Anesthesiologist: Brayton Caves Performed by: anesthesiologist   Preanesthetic Checklist Completed: patient identified, site marked, surgical consent, pre-op evaluation, timeout performed, IV checked, risks and benefits discussed and monitors and equipment checked  Epidural Patient position: sitting Prep: site prepped and draped and DuraPrep Patient monitoring: continuous pulse ox and blood pressure Approach: midline Injection technique: LOR air  Needle:  Needle type: Tuohy  Needle gauge: 17 G Needle length: 9 cm and 9 Needle insertion depth: 6 cm Catheter type: closed end flexible Catheter size: 19 Gauge Catheter at skin depth: 12 cm Test dose: negative  Assessment Events: blood not aspirated, injection not painful, no injection resistance, negative IV test and no paresthesia  Additional Notes Patient identified.  Risk benefits discussed including failed block, incomplete pain control, headache, nerve damage, paralysis, blood pressure changes, nausea, vomiting, reactions to medication both toxic or allergic, and postpartum back pain.  Patient expressed understanding and wished to proceed.  All questions were answered.  Sterile technique used throughout procedure and epidural site dressed with sterile barrier dressing. No paresthesia or other complications noted.The patient did not experience any signs of intravascular injection such as tinnitus or metallic taste in mouth nor signs of intrathecal spread such as rapid motor block. Please see nursing notes for vital signs.

## 2013-10-05 NOTE — Progress Notes (Signed)
Jacqueline Bennett, Interpreter at [facility] to speak to pt and family to discuss w/pt to remain in bed and to call and not get out of bed because of risk of falling.

## 2013-10-06 LAB — CBC
MCHC: 31.9 g/dL (ref 30.0–36.0)
MCV: 73.9 fL — ABNORMAL LOW (ref 78.0–100.0)
Platelets: 115 10*3/uL — ABNORMAL LOW (ref 150–400)
RBC: 3.56 MIL/uL — ABNORMAL LOW (ref 3.87–5.11)
RDW: 15.1 % (ref 11.5–15.5)
WBC: 8.3 10*3/uL (ref 4.0–10.5)

## 2013-10-06 NOTE — Discharge Summary (Signed)
Obstetric Discharge Summary Reason for Admission: induction of labor Prenatal Procedures: none Intrapartum Procedures: spontaneous vaginal delivery Postpartum Procedures: none Complications-Operative and Postpartum: none Hemoglobin  Date Value Range Status  10/06/2013 8.4* 12.0 - 15.0 g/dL Final     HCT  Date Value Range Status  10/06/2013 26.3* 36.0 - 46.0 % Final    Physical Exam:  General: alert Lochia: appropriate Uterine Fundus: firm Incision: healing well DVT Evaluation: No evidence of DVT seen on physical exam.  Discharge Diagnoses: Term Pregnancy-delivered  Discharge Information: Date: 10/06/2013 Activity: pelvic rest Diet: routine Medications: Percocet Condition: stable Instructions: refer to practice specific booklet Discharge to: home Follow-up Information   Schedule an appointment as soon as possible for a visit with Kathreen Cosier, MD.   Specialty:  Obstetrics and Gynecology   Contact information:   [address] [place] [address] [phone]       Newborn Data: Live born female  Birth Weight: 7 lb 5.6 oz (3334 g) APGAR: 8, 9  Home with mother.  Jacqueline Bennett A 10/06/2013, 6:31 AM

## 2013-10-06 NOTE — Anesthesia Postprocedure Evaluation (Signed)
Anesthesia Post Note  Patient: Jacqueline Bennett  Procedure(s) Performed: * No procedures listed *  Anesthesia type: Epidural  Patient location: Mother/Baby  Post pain: Pain level controlled  Post assessment: Post-op Vital signs reviewed  Last Vitals:  Filed Vitals:   10/06/13 0555  BP: 80/57  Pulse: 71  Temp: 36.3 C  Resp: 18    Post vital signs: Reviewed  Level of consciousness: awake  Complications: No apparent anesthesia complications

## 2013-10-06 NOTE — Lactation Note (Signed)
This note was copied from the chart of Jacqueline Tija Flores-Rodriguez. Lactation Consultation Note  Patient Name: Jacqueline Bennett [id number] Date: 10/06/2013 Reason for consult: Initial assessment Mom is experienced BF, denies questions or concerns. Mom plans to breast and bottle feed. Encouraged to BF with each feeding to encourage milk production. Guidelines for supplementing with breastfeeding reviewed with Mom. Lactation brochure left for review, advised of OP services and support group. Encouraged Mom to call for questions or concerns. Eda, Spanish interpreter present for visit.   Maternal Data Formula Feeding for Exclusion: Yes Reason for exclusion: Mother's choice to formula and breast feed on admission Infant to breast within first hour of birth: Yes Has patient been taught Hand Expression?: Yes Does the patient have breastfeeding experience prior to this delivery?: Yes  Feeding Feeding Type: Breast Fed Length of feed: 30 min  LATCH Score/Interventions Latch: Grasps breast easily, tongue down, lips flanged, rhythmical sucking. Intervention(s): Adjust position;Assist with latch;Breast massage;Breast compression  Audible Swallowing: A few with stimulation  Type of Nipple: Everted at rest and after stimulation  Comfort (Breast/Nipple): Soft / non-tender     Hold (Positioning): No assistance needed to correctly position infant at breast.  LATCH Score: 9  Lactation Tools Discussed/Used     Consult Status Consult Status: Complete    Alfred Levins 10/06/2013, 2:38 PM

## 2013-10-06 NOTE — Discharge Instructions (Signed)
Discharge instructions   You can wash your hair  Shower  Eat what you want  Drink what you want  See me in 6 weeks  Your ankles are going to swell more in the next 2 weeks than when pregnant  No sex for 6 weeks   Jacqueline Bennett A, MD 10/06/2013

## 2013-10-08 NOTE — Progress Notes (Signed)
Pt discharged before CSW could assess history of PP depression. 

## 2013-10-11 NOTE — Progress Notes (Signed)
Post discharge chart review completed.  

## 2014-08-15 ENCOUNTER — Encounter (HOSPITAL_COMMUNITY): Payer: Self-pay

## 2016-02-27 ENCOUNTER — Encounter (HOSPITAL_COMMUNITY): Payer: Self-pay | Admitting: *Deleted

## 2016-02-27 ENCOUNTER — Ambulatory Visit (HOSPITAL_COMMUNITY)
Admission: EM | Admit: 2016-02-27 | Discharge: 2016-02-27 | Disposition: A | Discharge status: Home / Self Care | Payer: Medicaid Other | Attending: Family Medicine | Admitting: Family Medicine

## 2016-02-27 DIAGNOSIS — L600 Ingrowing nail: Secondary | ICD-10-CM

## 2016-02-27 MED ORDER — HYDROCODONE-ACETAMINOPHEN 5-325 MG PO TABS: 1.0000 | ORAL_TABLET | ORAL | Status: DC | PRN

## 2016-02-27 MED ORDER — CICLOPIROX 8 % EX SOLN: Freq: Every day | CUTANEOUS | Status: DC

## 2016-02-27 MED ORDER — BUPIVACAINE HCL (PF) 0.5 % IJ SOLN
INTRAMUSCULAR | Status: AC
  Filled 2016-02-27: qty 10

## 2016-02-27 NOTE — ED Notes (Signed)
Pt  Has  An  Ingrown  Toenail  r  Big  Toe         About  1  Week   Ago  Worse   Last          sev  Days  Pt   timmed  Her  Nail  About 1  Week  ago

## 2016-02-27 NOTE — ED Provider Notes (Signed)
CSN: [id number]     Arrival date & time 02/27/16  1507 History   First MD Initiated Contact with Patient 02/27/16 1652     Chief Complaint  Patient presents with  . Toe Pain   (Consider location/radiation/quality/duration/timing/severity/associated sxs/prior Treatment) Patient is a 40 y.o. female presenting with toe pain. The history is provided by the patient and a friend. The history is limited by a language barrier. A language interpreter was used (friend interpr).  Toe Pain This is a new problem. The current episode started more than 1 week ago. The problem has been gradually worsening. The symptoms are aggravated by walking.    Past Medical History  Diagnosis Date  . Gestational diabetes     2nd preg  . Depression     ppd with third preg with meds  . Pregnancy induced hypertension     first preg   History reviewed. No pertinent past surgical history. Family History  Problem Relation Age of Onset  . Alcohol abuse Neg Hx   . Arthritis Neg Hx   . Asthma Neg Hx   . Birth defects Neg Hx   . Cancer Neg Hx   . COPD Neg Hx   . Depression Neg Hx   . Diabetes Neg Hx   . Drug abuse Neg Hx   . Early death Neg Hx   . Hearing loss Neg Hx   . Heart disease Neg Hx   . Hyperlipidemia Neg Hx   . Hypertension Neg Hx   . Learning disabilities Neg Hx   . Kidney disease Neg Hx   . Mental illness Neg Hx   . Mental retardation Neg Hx   . Miscarriages / Stillbirths Neg Hx   . Stroke Neg Hx   . Vision loss Neg Hx    Social History  Substance Use Topics  . Smoking status: Never Smoker   . Smokeless tobacco: Never Used  . Alcohol Use: No   OB History    Gravida Para Term Preterm AB TAB SAB Ectopic Multiple Living   7 6 6  1  1   6      Review of Systems  Constitutional: Negative.   Musculoskeletal: Positive for gait problem.  All other systems reviewed and are negative.   Allergies  Review of patient's allergies indicates no known allergies.  Home Medications   Prior to  Admission medications   Medication Sig Start Date End Date Taking? Authorizing Provider  ciclopirox (PENLAC) 8 % solution Apply topically at bedtime. Apply over nail and surrounding skin. Apply daily over previous coat. After seven (7) days, may remove with alcohol and continue cycle. 02/27/16   Linna HoffJames D Diamonte Stavely, MD  HYDROcodone-acetaminophen (NORCO/VICODIN) 5-325 MG tablet Take 1 tablet by mouth every 4 (four) hours as needed. For pain 02/27/16   Linna HoffJames D Chantz Montefusco, MD   Meds Ordered and Administered this Visit  Medications - No data to display  BP 132/72 mmHg  Pulse 58  Temp(Src) 98 F (36.7 C) (Oral)  Resp 12  SpO2 100%  LMP 02/26/2016 No data found.   Physical Exam  Constitutional: She is oriented to person, place, and time. She appears well-developed and well-nourished. No distress.  Musculoskeletal: She exhibits tenderness.       Feet:  Neurological: She is alert and oriented to person, place, and time.  Skin: Skin is warm and dry.  Nursing note and vitals reviewed.   ED Course  .Nail Removal Date/Time: 02/27/2016 5:57 PM Performed by: Linna HoffKINDL, Ofelia Podolski D  Authorized by: Bradd Canary D Consent: Verbal consent obtained. Consent given by: patient Location: right foot Location details: right big toe Anesthesia: local infiltration Local anesthetic: bupivacaine 0.5% without epinephrine Patient sedated: no Preparation: skin prepped with Betadine Amount removed: 1/3 Side: ulnar Wedge excision of skin of nail fold: yes Nail bed sutured: no Nail matrix removed: none Removed nail replaced and anchored: no Dressing: antibiotic ointment and Xeroform gauze Patient tolerance: Patient tolerated the procedure well with no immediate complications   (including critical care time)  Labs Review Labs Reviewed - No data to display  Imaging Review No results found.   Visual Acuity Review  Right Eye Distance:   Left Eye Distance:   Bilateral Distance:    Right Eye Near:   Left Eye  Near:    Bilateral Near:         MDM   1. Ingrown right big toenail        Linna Hoff, MD 02/27/16 [phone]

## 2016-11-18 ENCOUNTER — Encounter (HOSPITAL_COMMUNITY): Payer: Self-pay | Admitting: Emergency Medicine

## 2016-11-18 ENCOUNTER — Ambulatory Visit (HOSPITAL_COMMUNITY)
Admission: EM | Admit: 2016-11-18 | Discharge: 2016-11-18 | Disposition: A | Discharge status: Home / Self Care | Payer: Self-pay | Attending: Internal Medicine | Admitting: Internal Medicine

## 2016-11-18 ENCOUNTER — Ambulatory Visit (INDEPENDENT_AMBULATORY_CARE_PROVIDER_SITE_OTHER): Payer: Self-pay

## 2016-11-18 DIAGNOSIS — S93402A Sprain of unspecified ligament of left ankle, initial encounter: Secondary | ICD-10-CM

## 2016-11-18 MED ORDER — NAPROXEN 500 MG PO TABS: 500.0000 mg | ORAL_TABLET | Freq: Two times a day (BID) | ORAL | 0 refills | Status: DC

## 2016-11-18 NOTE — ED Provider Notes (Signed)
CSN: [id number]     Arrival date & time 11/18/16  1916 History   First MD Initiated Contact with Patient 11/18/16 2048     Chief Complaint  Patient presents with  . Ankle Pain   (Consider location/radiation/quality/duration/timing/severity/associated sxs/prior Treatment) Pt brought in for a slip tonight and has pain and swelling to the lt ankle. States that she is not able to apply pressure. Denies any previous injury. Has not taken anything for this.       Past Medical History:  Diagnosis Date  . Depression    ppd with third preg with meds  . Gestational diabetes    2nd preg  . Pregnancy induced hypertension    first preg   History reviewed. No pertinent surgical history. Family History  Problem Relation Age of Onset  . Alcohol abuse Neg Hx   . Arthritis Neg Hx   . Asthma Neg Hx   . Birth defects Neg Hx   . Cancer Neg Hx   . COPD Neg Hx   . Depression Neg Hx   . Diabetes Neg Hx   . Drug abuse Neg Hx   . Early death Neg Hx   . Hearing loss Neg Hx   . Heart disease Neg Hx   . Hyperlipidemia Neg Hx   . Hypertension Neg Hx   . Learning disabilities Neg Hx   . Kidney disease Neg Hx   . Mental illness Neg Hx   . Mental retardation Neg Hx   . Miscarriages / Stillbirths Neg Hx   . Stroke Neg Hx   . Vision loss Neg Hx    Social History  Substance Use Topics  . Smoking status: Never Smoker  . Smokeless tobacco: Never Used  . Alcohol use No   OB History    Gravida Para Term Preterm AB Living   7 6 6   1 6    SAB TAB Ectopic Multiple Live Births   1       6     Review of Systems  Constitutional: Negative.   Respiratory: Negative.   Cardiovascular: Negative.   Musculoskeletal: Positive for joint swelling.  Skin:       Swelling to lt ankle     Allergies  Patient has no known allergies.  Home Medications   Prior to Admission medications   Medication Sig Start Date End Date Taking? Authorizing Provider  naproxen (NAPROSYN) 500 MG tablet Take 1 tablet (500  mg total) by mouth 2 (two) times daily. 11/18/16   Tobi BastosMelanie A Michaela Shankel, NP   Meds Ordered and Administered this Visit  Medications - No data to display  BP 125/73 (BP Location: Right Arm)   Pulse 65   Temp 97.8 F (36.6 C) (Oral)   Resp 18   SpO2 98%  No data found.   Physical Exam  Constitutional: She appears well-developed.  Cardiovascular: Normal rate and regular rhythm.   Pulmonary/Chest: Effort normal and breath sounds normal.  Musculoskeletal:  +2 pitting edema noted to lateral area of ankle. Strong pedal pulses, warm, has full rom but pain upon movement   Neurological: She is alert.  Skin: Capillary refill takes less than 2 seconds.  +2 edema noted to latera area of the ankle     Urgent Care Course     Procedures (including critical care time)  Labs Review Labs Reviewed - No data to display  Imaging Review Dg Ankle Complete Left  Result Date: 11/18/2016 CLINICAL DATA:  Pain following inversion type injury  EXAM: LEFT ANKLE COMPLETE - 3+ VIEW COMPARISON:  None. FINDINGS: Frontal, oblique, and lateral views were obtained. There is soft tissue swelling laterally. There is a small avulsion arising the lateral malleolus. No other fracture evident. No appreciable joint effusion. The ankle mortise appears intact. No appreciable joint space narrowing. IMPRESSION: Soft tissue swelling laterally with small avulsion arising the lateral malleolus. Ankle mortise appears intact. No appreciable joint space narrowing or erosion. Electronically Signed   By: Bretta Bang III M.D.   On: 11/18/2016 21:18             MDM   1. Sprain of left ankle, unspecified ligament, initial encounter    You will need to call ortho in the morning to see them as soon as possible Take pain meds as needed Apply ice and elevate .  Will apply an ASO for support  Did not see a fracture in the x ray if you continue to have pain 1 week post you will need to see ortho     Tobi Bastos,  NP 11/18/16 2122    Tobi Bastos, NP 11/19/16 1101    Tobi Bastos, NP 11/19/16 1101

## 2016-11-18 NOTE — ED Triage Notes (Signed)
[facility] a [facility] pain secondary to a fall that occurred today. The patient did have visible swelling but limited ROM.

## 2016-11-18 NOTE — Discharge Instructions (Signed)
You will need to call ortho in the morning to see them as soon as possible Take pain meds as needed Apply ice and elevate .

## 2019-03-19 ENCOUNTER — Other Ambulatory Visit (HOSPITAL_COMMUNITY): Payer: Self-pay | Admitting: *Deleted

## 2019-03-19 DIAGNOSIS — N6320 Unspecified lump in the left breast, unspecified quadrant: Secondary | ICD-10-CM

## 2019-03-25 ENCOUNTER — Other Ambulatory Visit (HOSPITAL_COMMUNITY): Payer: Self-pay | Admitting: Obstetrics and Gynecology

## 2019-03-25 ENCOUNTER — Ambulatory Visit
Admission: RE | Admit: 2019-03-25 | Discharge: 2019-03-25 | Disposition: A | Discharge status: Home / Self Care | Payer: No Typology Code available for payment source | Source: Ambulatory Visit | Attending: Obstetrics and Gynecology | Admitting: Obstetrics and Gynecology

## 2019-03-25 ENCOUNTER — Ambulatory Visit (HOSPITAL_COMMUNITY)
Admission: RE | Admit: 2019-03-25 | Discharge: 2019-03-25 | Disposition: A | Discharge status: Home / Self Care | Payer: Self-pay | Source: Ambulatory Visit | Attending: Obstetrics and Gynecology | Admitting: Obstetrics and Gynecology

## 2019-03-25 ENCOUNTER — Encounter (HOSPITAL_COMMUNITY): Payer: Self-pay

## 2019-03-25 ENCOUNTER — Other Ambulatory Visit: Payer: Self-pay

## 2019-03-25 VITALS — BP 110/78 | Temp 98.3°F | Wt 212.0 lb

## 2019-03-25 DIAGNOSIS — R2232 Localized swelling, mass and lump, left upper limb: Secondary | ICD-10-CM

## 2019-03-25 DIAGNOSIS — Z01419 Encounter for gynecological examination (general) (routine) without abnormal findings: Secondary | ICD-10-CM

## 2019-03-25 DIAGNOSIS — N6320 Unspecified lump in the left breast, unspecified quadrant: Secondary | ICD-10-CM

## 2019-03-25 DIAGNOSIS — N644 Mastodynia: Secondary | ICD-10-CM

## 2019-03-25 NOTE — Patient Instructions (Signed)
Explained breast self awareness with [name]. Let patient know [facility] will cover Pap smears and HPV typing every 5 years unless has a history of abnormal Pap smears. Referred patient to the [facility] for a diagnostic mammogram and left breast ultrasound. Appointment scheduled for Thursday, March 25, 2019 at 1520. Patient aware of appointment and will be there. Let patient know will follow up with her within the next couple weeks with results of Pap smear by letter or phone. Jacqueline Bennett verbalized understanding.  [name], Arvil Chaco, RN 2:49 PM

## 2019-03-25 NOTE — Progress Notes (Signed)
Complaints of left axillary lump x 5 years that has decreased in size. Patient complained of pain when palpated left axillary lump and redness. Patient rates the pain at a 5 out of 10.  Pap Smear: Pap smear completed today. Last Pap smear was 5 years ago at the [facility] and normal per patient. Per patient has no history of an abnormal Pap smear. No Pap smear results are in Epic.  Physical exam: Breasts Breasts symmetrical. No skin abnormalities bilateral breasts. No redness observed on exam. No nipple retraction bilateral breasts. No nipple discharge bilateral breasts. No lymphadenopathy. No lumps palpated right breast. Palpated a lump within the left axilla at 1 o'clock 17 cm from the nipple. Complaints of left axillary and outer breast pain on exam. Referred patient to the [facility] for a diagnostic mammogram and left breast ultrasound. Appointment scheduled for Thursday, March 25, 2019 at 1520.        Pelvic/Bimanual   Ext Genitalia No lesions, no swelling and no discharge observed on external genitalia.         Vagina Vagina pink and normal texture. No lesions or discharge observed in vagina.          Cervix Cervix is present. Cervix pink and of normal texture. No discharge observed.     Uterus Uterus is present and palpable. Uterus in normal position and normal size.        Adnexae Bilateral ovaries present and palpable. No tenderness on palpation.         Rectovaginal No rectal exam completed today since patient had no rectal complaints. No skin abnormalities observed on exam.    Smoking History: Patient has never smoked.  Patient Navigation: Patient education provided. Access to services provided for patient through [facility] program. Spanish interpreter provided.   Breast and Cervical Cancer Risk Assessment: Patient has no family history of breast cancer, known genetic mutations, or radiation treatment to the chest before age 27. Patient  has no history of cervical dysplasia, immunocompromised, or DES exposure in-utero.  Risk Assessment    Risk Scores      03/25/2019   Last edited by: Armond Hang, LPN   5-year risk: 0.6 %   Lifetime risk: 7.5 %         Used Spanish interpreter Rudene Anda from [place].

## 2019-03-30 LAB — CYTOLOGY - PAP
Diagnosis: NEGATIVE
HPV: NOT DETECTED

## 2019-03-31 ENCOUNTER — Encounter (HOSPITAL_COMMUNITY): Payer: Self-pay | Admitting: *Deleted

## 2019-05-21 ENCOUNTER — Telehealth (HOSPITAL_COMMUNITY): Payer: Self-pay | Admitting: *Deleted

## 2019-05-21 NOTE — Telephone Encounter (Signed)
Called patient with Spanish interpreter Jacqueline Bennett to discuss Pap smear results. Let patient know that her Pap smear was normal and HPV negative. Told patient that her next Pap smear is due in 5 years since she has no history of an abnormal Pap smear. Patient verbalized understanding.

## 2019-09-27 ENCOUNTER — Other Ambulatory Visit: Payer: No Typology Code available for payment source

## 2020-07-27 ENCOUNTER — Ambulatory Visit (INDEPENDENT_AMBULATORY_CARE_PROVIDER_SITE_OTHER): Payer: Self-pay

## 2020-07-27 ENCOUNTER — Encounter (HOSPITAL_COMMUNITY): Payer: Self-pay | Admitting: Emergency Medicine

## 2020-07-27 ENCOUNTER — Other Ambulatory Visit: Payer: Self-pay

## 2020-07-27 ENCOUNTER — Ambulatory Visit (HOSPITAL_COMMUNITY)
Admission: EM | Admit: 2020-07-27 | Discharge: 2020-07-27 | Disposition: A | Discharge status: Home / Self Care | Payer: Self-pay

## 2020-07-27 DIAGNOSIS — W19XXXA Unspecified fall, initial encounter: Secondary | ICD-10-CM

## 2020-07-27 DIAGNOSIS — S93402A Sprain of unspecified ligament of left ankle, initial encounter: Secondary | ICD-10-CM

## 2020-07-27 DIAGNOSIS — M25572 Pain in left ankle and joints of left foot: Secondary | ICD-10-CM

## 2020-07-27 MED ORDER — NAPROXEN 500 MG PO TABS: 500.0000 mg | ORAL_TABLET | Freq: Two times a day (BID) | ORAL | 0 refills | Status: AC

## 2020-07-27 NOTE — ED Provider Notes (Signed)
[facility]   MRN: [id number] DOB: 28-Dec-1975  Subjective:   Jacqueline Bennett is a 44 y.o. female presenting for suffering a left ankle injury this morning.  Patient states that she was exercising and tried jumping over the small wooden object.  She landed awkwardly and felt a pop with severe pain.  She fell thereafter and has not been able to bear much weight on her left ankle.  Has a previous history of ankle injuries, avulsion fracture in 2018.  Came straight to our clinic, did not try any interventions.  No current facility-administered medications for this encounter.  Current Outpatient Medications:    lisinopril-hydrochlorothiazide (ZESTORETIC) 20-12.5 MG tablet, Take 1 tablet by mouth daily., Disp: , Rfl:    naproxen (NAPROSYN) 500 MG tablet, Take 1 tablet (500 mg total) by mouth 2 (two) times daily. (Patient not taking: Reported on 03/25/2019), Disp: 30 tablet, Rfl: 0   No Known Allergies  Past Medical History:  Diagnosis Date   Depression    ppd with third preg with meds   Gestational diabetes    2nd preg   Pregnancy induced hypertension    first preg     History reviewed. No pertinent surgical history.  Family History  Problem Relation Age of Onset   Hypertension Mother    Alcohol abuse Neg Hx    Arthritis Neg Hx    Asthma Neg Hx    Birth defects Neg Hx    Cancer Neg Hx    COPD Neg Hx    Depression Neg Hx    Diabetes Neg Hx    Drug abuse Neg Hx    Early death Neg Hx    Hearing loss Neg Hx    Heart disease Neg Hx    Hyperlipidemia Neg Hx    Learning disabilities Neg Hx    Kidney disease Neg Hx    Mental illness Neg Hx    Mental retardation Neg Hx    Miscarriages / Stillbirths Neg Hx    Stroke Neg Hx    Vision loss Neg Hx     Social History   Tobacco Use   Smoking status: Never Smoker   Smokeless tobacco: Never Used  Building services engineer Use: Never used  Substance Use Topics   Alcohol use: No     Drug use: No    ROS   Objective:   Vitals: BP 123/75 (BP Location: Left Arm)    Pulse 89    Temp 98.3 F (36.8 C) (Oral)    Resp 16    SpO2 98%   Physical Exam Constitutional:      General: She is not in acute distress.    Appearance: Normal appearance. She is well-developed. She is not ill-appearing, toxic-appearing or diaphoretic.  HENT:     Head: Normocephalic and atraumatic.     Nose: Nose normal.     Mouth/Throat:     Mouth: Mucous membranes are moist.     Pharynx: Oropharynx is clear.  Eyes:     General: No scleral icterus.       Right eye: No discharge.        Left eye: No discharge.     Extraocular Movements: Extraocular movements intact.     Conjunctiva/sclera: Conjunctivae normal.     Pupils: Pupils are equal, round, and reactive to light.  Cardiovascular:     Rate and Rhythm: Normal rate.  Pulmonary:     Effort: Pulmonary effort is normal.  Musculoskeletal:  Left ankle: Swelling present. No deformity, ecchymosis or lacerations. Tenderness present over the lateral malleolus, ATF ligament and AITF ligament. No medial malleolus, base of 5th metatarsal or proximal fibula tenderness. Decreased range of motion.  Skin:    General: Skin is warm and dry.  Neurological:     General: No focal deficit present.     Mental Status: She is alert and oriented to person, place, and time.  Psychiatric:        Mood and Affect: Mood normal.        Behavior: Behavior normal.        Thought Content: Thought content normal.        Judgment: Judgment normal.     DG Ankle Complete Left  Result Date: 07/27/2020 CLINICAL DATA:  Fall. EXAM: LEFT ANKLE COMPLETE - 3+ VIEW COMPARISON:  Left ankle x-rays dated November 18, 2016. FINDINGS: Small curvilinear ossific density at the tip of the lateral malleolus at the site of the prior avulsion fracture. No definite new fracture. No dislocation. The ankle mortise is symmetric. The talar dome is intact. Joint spaces are preserved. Bone  mineralization is normal. Mild soft tissue swelling over the lateral malleolus. IMPRESSION: 1. Old lateral malleolar avulsion fracture. No definite new fracture. Electronically Signed   By: Obie Dredge M.D.   On: 07/27/2020 13:22   Left ankle wrapped using 4" Ace wrap in figure-8 method.   Assessment and Plan :   PDMP not reviewed this encounter.  1. Acute left ankle pain   2. Sprain of left ankle, unspecified ligament, initial encounter     Will manage for ankle sprain with rice method, NSAID. Counseled patient on potential for adverse effects with medications prescribed/recommended today, ER and return-to-clinic precautions discussed, patient verbalized understanding.    Wallis Bamberg, PA-C 07/27/20 1339

## 2020-07-27 NOTE — ED Triage Notes (Signed)
Pt triaged by provider  

## 2021-03-09 ENCOUNTER — Emergency Department (HOSPITAL_COMMUNITY): Payer: Self-pay

## 2021-03-09 ENCOUNTER — Other Ambulatory Visit: Payer: Self-pay

## 2021-03-09 ENCOUNTER — Encounter (HOSPITAL_COMMUNITY): Payer: Self-pay | Admitting: Emergency Medicine

## 2021-03-09 ENCOUNTER — Emergency Department (HOSPITAL_COMMUNITY)
Admission: EM | Admit: 2021-03-09 | Discharge: 2021-03-09 | Disposition: A | Discharge status: Home / Self Care | Payer: Self-pay | Attending: Emergency Medicine | Admitting: Emergency Medicine

## 2021-03-09 DIAGNOSIS — K649 Unspecified hemorrhoids: Secondary | ICD-10-CM | POA: Insufficient documentation

## 2021-03-09 DIAGNOSIS — R1084 Generalized abdominal pain: Secondary | ICD-10-CM | POA: Insufficient documentation

## 2021-03-09 DIAGNOSIS — Z5321 Procedure and treatment not carried out due to patient leaving prior to being seen by health care provider: Secondary | ICD-10-CM | POA: Insufficient documentation

## 2021-03-09 DIAGNOSIS — K5900 Constipation, unspecified: Secondary | ICD-10-CM | POA: Insufficient documentation

## 2021-03-09 LAB — COMPREHENSIVE METABOLIC PANEL
ALT: 35 U/L (ref 0–44)
AST: 25 U/L (ref 15–41)
Albumin: 4.3 g/dL (ref 3.5–5.0)
Alkaline Phosphatase: 76 U/L (ref 38–126)
Anion gap: 8 (ref 5–15)
BUN: 8 mg/dL (ref 6–20)
CO2: 28 mmol/L (ref 22–32)
Calcium: 9.6 mg/dL (ref 8.9–10.3)
Chloride: 103 mmol/L (ref 98–111)
Creatinine, Ser: 0.59 mg/dL (ref 0.44–1.00)
GFR, Estimated: >60 mL/min (ref >60)
Glucose, Bld: 98 mg/dL (ref 70–99)
Potassium: 4.1 mmol/L (ref 3.5–5.1)
Sodium: 139 mmol/L (ref 135–145)
Total Bilirubin: 0.9 mg/dL (ref 0.3–1.2)
Total Protein: 8 g/dL (ref 6.5–8.1)

## 2021-03-09 LAB — CBC WITH DIFFERENTIAL/PLATELET
Abs Immature Granulocytes: 0.02 10*3/uL (ref 0.00–0.07)
Basophils Absolute: 0.1 10*3/uL (ref 0.0–0.1)
Basophils Relative: 1 %
Eosinophils Absolute: 0.1 10*3/uL (ref 0.0–0.5)
Eosinophils Relative: 1 %
HCT: 41.7 % (ref 36.0–46.0)
Hemoglobin: 14.3 g/dL (ref 12.0–15.0)
Immature Granulocytes: 0 %
Lymphocytes Relative: 30 %
Lymphs Abs: 2.5 10*3/uL (ref 0.7–4.0)
MCH: 30.4 pg (ref 26.0–34.0)
MCHC: 34.3 g/dL (ref 30.0–36.0)
MCV: 88.5 fL (ref 80.0–100.0)
Monocytes Absolute: 0.6 10*3/uL (ref 0.1–1.0)
Monocytes Relative: 7 %
Neutro Abs: 5.2 10*3/uL (ref 1.7–7.7)
Neutrophils Relative %: 61 %
Platelets: 171 10*3/uL (ref 150–400)
RBC: 4.71 MIL/uL (ref 3.87–5.11)
RDW: 12.4 % (ref 11.5–15.5)
WBC: 8.5 10*3/uL (ref 4.0–10.5)
nRBC: 0 % (ref 0.0–0.2)

## 2021-03-09 LAB — I-STAT BETA HCG BLOOD, ED (MC, WL, AP ONLY): I-stat hCG, quantitative: <5 m[IU]/mL (ref <5)

## 2021-03-09 LAB — LIPASE, BLOOD: Lipase: 33 U/L (ref 11–51)

## 2021-03-09 NOTE — ED Triage Notes (Signed)
Patient reports bleeding/painful hemorrhoid today with left abdominal pain , no emesis or diarrhea . Mild constipation .

## 2021-03-09 NOTE — ED Notes (Signed)
Patient called x3 for vitals recheck with no response and not visible in lobby 

## 2021-03-09 NOTE — ED Provider Notes (Signed)
Emergency Medicine Provider Triage Evaluation Note  Jacqueline Bennett , a 45 y.o. female  was evaluated in triage.  Pt complains of Abd pain, constipation.  Has hemorrhoids which she feels like it been worsening over the last week.  She states now she has a bowel movement with a bleed.  States she has generalized abdominal pain.  Had a bowel movement 2 hours ago which was very hard to pass.  No fever, vomiting.  Review of Systems  Positive: abd pain, constipation Negative: Fever, emesis, dysuria  Physical Exam  There were no vitals taken for this visit. Gen:   Awake, no distress   Resp:  Normal effort  MSK:   Moves extremities without difficulty  ABD:  Diffuse tenderness, worse to lower abdomen Other:    Medical Decision Making  Medically screening exam initiated at 7:06 PM.  Appropriate orders placed.  Jacqueline Bennett was informed that the remainder of the evaluation will be completed by another provider, this initial triage assessment does not replace that evaluation, and the importance of remaining in the ED until their evaluation is complete.  abd pain, constipation  Labs and imaging ordered>> stable for waiting   Linwood Dibbles, PA-C 03/09/21 1915    Sabino Donovan, MD 03/09/21 [phone]

## 2023-09-16 DIAGNOSIS — I10 Essential (primary) hypertension: Secondary | ICD-10-CM | POA: Insufficient documentation

## 2023-09-26 DIAGNOSIS — N393 Stress incontinence (female) (male): Secondary | ICD-10-CM | POA: Insufficient documentation

## 2023-09-26 DIAGNOSIS — Z62810 Personal history of physical and sexual abuse in childhood: Secondary | ICD-10-CM | POA: Insufficient documentation

## 2023-10-01 DIAGNOSIS — R7303 Prediabetes: Secondary | ICD-10-CM | POA: Insufficient documentation

## 2023-10-01 DIAGNOSIS — E7800 Pure hypercholesterolemia, unspecified: Secondary | ICD-10-CM | POA: Insufficient documentation

## 2024-01-05 ENCOUNTER — Ambulatory Visit (INDEPENDENT_AMBULATORY_CARE_PROVIDER_SITE_OTHER): Payer: Self-pay

## 2024-01-05 ENCOUNTER — Encounter (HOSPITAL_COMMUNITY): Payer: Self-pay

## 2024-01-05 ENCOUNTER — Ambulatory Visit (HOSPITAL_COMMUNITY)
Admission: EM | Admit: 2024-01-05 | Discharge: 2024-01-05 | Disposition: A | Discharge status: Home / Self Care | Payer: Self-pay | Attending: Family Medicine | Admitting: Family Medicine

## 2024-01-05 DIAGNOSIS — R109 Unspecified abdominal pain: Secondary | ICD-10-CM

## 2024-01-05 DIAGNOSIS — K5900 Constipation, unspecified: Secondary | ICD-10-CM

## 2024-01-05 DIAGNOSIS — K649 Unspecified hemorrhoids: Secondary | ICD-10-CM

## 2024-01-05 MED ORDER — LACTULOSE 20 GM/30ML PO SOLN: 30.0000 mL | Freq: Two times a day (BID) | ORAL | 0 refills | Status: AC | PRN

## 2024-01-05 NOTE — ED Triage Notes (Signed)
 Pt c/o constipation x1wk. Took Miralax with no relief. States had bleeding hemorrhoids last night from straining.

## 2024-01-05 NOTE — Discharge Instructions (Addendum)
 You may try using over the counter preparation H with lidocaine for your hemorrhoid pain.

## 2024-01-08 NOTE — ED Provider Notes (Signed)
 [facility]   [id number] 01/05/24 Arrival Time: 1058  ASSESSMENT & PLAN:  1. Abdominal discomfort   2. Constipation, unspecified constipation type   3. Hemorrhoids, unspecified hemorrhoid type    Benign abdomen. I have personally viewed and independently interpreted the imaging studies ordered this visit. Abd 2v: no signs of SBO. Moderate stool.  Discussed typical duration of likely viral illness.  OTC symptom care as needed. Trial of: Meds ordered this encounter  Medications   Lactulose 20 GM/30ML SOLN    Sig: Take 30 mLs (20 g total) by mouth 2 (two) times daily as needed (for constipation).    Dispense:  450 mL    Refill:  0   Ensure hydration. Declines looking at hemorrhoids but reports long standing history of; desires surgery referral  Order placed.  Follow-up Information     Kathreen Cosier, MD.   Specialty: Obstetrics and Gynecology Why: If worsening or failing to improve as anticipated. Contact information: [address] [place] [address] [phone]                 Reviewed expectations re: course of current medical issues. Questions answered. Outlined signs and symptoms indicating need for more acute intervention. Understanding verbalized. After Visit Summary given.   SUBJECTIVE: History from: Patient. Jacqueline Bennett is a 48 y.o. female. Pt c/o constipation x1wk. Took Miralax with no relief. States had bleeding hemorrhoids last night from straining. With nausea today; feeling very distended and bloated. Long h/o hemorrhoid problems. Denies: fever. Normal PO intake without n/v/d.  OBJECTIVE:  Vitals:   01/05/24 1127  BP: (!) 127/90  Pulse: (!) 106  Resp: 18  Temp: 98.5 F (36.9 C)  TempSrc: Oral  SpO2: 97%    General appearance: alert; no distress Abd: obese; NT; soft Lungs: speaks full sentences without difficulty; unlabored Extremities: no edema Skin: warm and dry Neurologic: normal gait Psychological:  alert and cooperative; normal mood and affect  Imaging: DG Abd 2 Views Result Date: 01/05/2024 CLINICAL DATA:  Abdominal pain.  Constipation for the past week. EXAM: ABDOMEN - 2 VIEW COMPARISON:  03/09/2021 FINDINGS: Normal bowel-gas pattern. The hemidiaphragms are not included on the upright view to assess for free peritoneal air. Moderate amount of stool in the colon. Minimal lumbar spine degenerative changes. IMPRESSION: 1. Moderate amount of stool in the colon. 2. No bowel obstruction. Electronically Signed   By: Beckie Salts M.D.   On: 01/05/2024 13:05    No Known Allergies  Past Medical History:  Diagnosis Date   Depression    ppd with third preg with meds   Gestational diabetes    2nd preg   Pregnancy induced hypertension    first preg   Social History   Socioeconomic History   Marital status: Married    Spouse name: Not on file   Number of children: 6   Years of education: Not on file   Highest education level: 9th grade  Occupational History   Not on file  Tobacco Use   Smoking status: Never   Smokeless tobacco: Never  Vaping Use   Vaping status: Never Used  Substance and Sexual Activity   Alcohol use: No   Drug use: No   Sexual activity: Yes  Other Topics Concern   Not on file  Social History Narrative   Not on file   Social Drivers of Health   Financial Resource Strain: Not on file  Food Insecurity: Not on file  Transportation Needs: No Transportation  Needs (03/25/2019)   PRAPARE - Administrator, Civil Service (Medical): No    Lack of Transportation (Non-Medical): No  Physical Activity: Not on file  Stress: Not on file  Social Connections: Unknown (02/26/2022)   Received from [facility]   Social Network    Social Network: Not on file  Intimate Partner Violence: Unknown (01/18/2022)   Received from [facility]   HITS    Physically Hurt: Not on file    Insult or Talk Down To: Not on file    Threaten Physical Harm: Not on file     Scream or Curse: Not on file   Family History  Problem Relation Age of Onset   Hypertension Mother    Alcohol abuse Neg Hx    Arthritis Neg Hx    Asthma Neg Hx    Birth defects Neg Hx    Cancer Neg Hx    COPD Neg Hx    Depression Neg Hx    Diabetes Neg Hx    Drug abuse Neg Hx    Early death Neg Hx    Hearing loss Neg Hx    Heart disease Neg Hx    Hyperlipidemia Neg Hx    Learning disabilities Neg Hx    Kidney disease Neg Hx    Mental illness Neg Hx    Mental retardation Neg Hx    Miscarriages / Stillbirths Neg Hx    Stroke Neg Hx    Vision loss Neg Hx    History reviewed. No pertinent surgical history.   Mardella Layman, MD 01/08/24 [phone]

## 2024-07-13 ENCOUNTER — Ambulatory Visit: Payer: Self-pay | Admitting: Surgery

## 2024-07-13 ENCOUNTER — Encounter: Payer: Self-pay | Admitting: Surgery

## 2024-07-13 DIAGNOSIS — K5909 Other constipation: Secondary | ICD-10-CM | POA: Insufficient documentation

## 2024-07-13 DIAGNOSIS — E66813 Obesity, class 3: Secondary | ICD-10-CM | POA: Insufficient documentation

## 2024-07-13 DIAGNOSIS — K625 Hemorrhage of anus and rectum: Secondary | ICD-10-CM | POA: Insufficient documentation

## 2024-07-13 DIAGNOSIS — Z789 Other specified health status: Secondary | ICD-10-CM | POA: Insufficient documentation

## 2024-08-19 ENCOUNTER — Other Ambulatory Visit: Payer: Self-pay | Admitting: Surgery

## 2024-08-24 LAB — SURGICAL PATHOLOGY
